# Patient Record
Sex: Female | Born: 1985 | Race: Black or African American | Hispanic: No | Marital: Single | Smoking: Never smoker
Health system: Southern US, Community
[De-identification: ages and names within clinical notes are randomized; demographics above are authoritative.]

## PROBLEM LIST (undated history)

## (undated) DIAGNOSIS — R519 Headache, unspecified: Secondary | ICD-10-CM

## (undated) DIAGNOSIS — I10 Essential (primary) hypertension: Secondary | ICD-10-CM

## (undated) DIAGNOSIS — G932 Benign intracranial hypertension: Secondary | ICD-10-CM

## (undated) HISTORY — DX: Headache, unspecified: R51.9

## (undated) HISTORY — DX: Essential (primary) hypertension: I10

## (undated) HISTORY — DX: Benign intracranial hypertension: G93.2

---
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
## 2014-06-06 ENCOUNTER — Encounter (HOSPITAL_COMMUNITY): Payer: Self-pay | Admitting: Emergency Medicine

## 2014-06-06 ENCOUNTER — Emergency Department (HOSPITAL_COMMUNITY)
Admission: EM | Admit: 2014-06-06 | Discharge: 2014-06-06 | Disposition: A | Discharge status: Home / Self Care | Payer: 59 | Source: Home / Self Care | Attending: Family Medicine | Admitting: Family Medicine

## 2014-06-06 DIAGNOSIS — J039 Acute tonsillitis, unspecified: Secondary | ICD-10-CM

## 2014-06-06 DIAGNOSIS — IMO0001 Reserved for inherently not codable concepts without codable children: Secondary | ICD-10-CM

## 2014-06-06 DIAGNOSIS — R030 Elevated blood-pressure reading, without diagnosis of hypertension: Secondary | ICD-10-CM

## 2014-06-06 LAB — POCT RAPID STREP A: STREPTOCOCCUS, GROUP A SCREEN (DIRECT): NEGATIVE

## 2014-06-06 MED ORDER — CLINDAMYCIN HCL 300 MG PO CAPS: 300.0000 mg | ORAL_CAPSULE | Freq: Four times a day (QID) | ORAL | Status: DC

## 2014-06-06 NOTE — ED Notes (Signed)
Patient complains of sore throat that started this am Also complains of bilateral ear pain

## 2014-06-06 NOTE — Discharge Instructions (Signed)
Your rapid strep test was negative. You are being treated for atypical causes of tonsillitis. In addition, you should be aware that your blood pressure is quite elevated at 173/114 and must be evaluated by the primary care doctor of your choice as soon as possible should you need to begin treatment.   Tonsillitis Tonsillitis is an infection of the throat that causes the tonsils to become red, tender, and swollen. Tonsils are collections of lymphoid tissue at the back of the throat. Each tonsil has crevices (crypts). Tonsils help fight nose and throat infections and keep infection from spreading to other parts of the body for the first 18 months of life.  CAUSES Sudden (acute) tonsillitis is usually caused by infection with streptococcal bacteria. Long-lasting (chronic) tonsillitis occurs when the crypts of the tonsils become filled with pieces of food and bacteria, which makes it easy for the tonsils to become repeatedly infected. SYMPTOMS  Symptoms of tonsillitis include:  A sore throat, with possible difficulty swallowing.  Keithly patches on the tonsils.  Fever.  Tiredness.  New episodes of snoring during sleep, when you did not snore before.  Small, foul-smelling, yellowish-Hodkinson pieces of material (tonsilloliths) that you occasionally cough up or spit out. The tonsilloliths can also cause you to have bad breath. DIAGNOSIS Tonsillitis can be diagnosed through a physical exam. Diagnosis can be confirmed with the results of lab tests, including a throat culture. TREATMENT  The goals of tonsillitis treatment include the reduction of the severity and duration of symptoms and prevention of associated conditions. Symptoms of tonsillitis can be improved with the use of steroids to reduce the swelling. Tonsillitis caused by bacteria can be treated with antibiotic medicines. Usually, treatment with antibiotic medicines is started before the cause of the tonsillitis is known. However, if it is  determined that the cause is not bacterial, antibiotic medicines will not treat the tonsillitis. If attacks of tonsillitis are severe and frequent, your health care provider may recommend surgery to remove the tonsils (tonsillectomy). HOME CARE INSTRUCTIONS   Rest as much as possible and get plenty of sleep.  Drink plenty of fluids. While the throat is very sore, eat soft foods or liquids, such as sherbet, soups, or instant breakfast drinks.  Eat frozen ice pops.  Gargle with a warm or cold liquid to help soothe the throat. Mix 1/4 teaspoon of salt and 1/4 teaspoon of baking soda in 8 oz of water. SEEK MEDICAL CARE IF:   Large, tender lumps develop in your neck.  A rash develops.  A green, yellow-brown, or bloody substance is coughed up.  You are unable to swallow liquids or food for 24 hours.  You notice that only one of the tonsils is swollen. SEEK IMMEDIATE MEDICAL CARE IF:   You develop any new symptoms such as vomiting, severe headache, stiff neck, chest pain, or trouble breathing or swallowing.  You have severe throat pain along with drooling or voice changes.  You have severe pain, unrelieved with recommended medications.  You are unable to fully open the mouth.  You develop redness, swelling, or severe pain anywhere in the neck.  You have a fever. MAKE SURE YOU:   Understand these instructions.  Will watch your condition.  Will get help right away if you are not doing well or get worse. Document Released: 06/12/2005 Document Revised: 01/17/2014 Document Reviewed: 02/19/2013 [facility] Patient Information 2015 [place], [place]. This information is not intended to replace advice given to you by your health care provider. Make sure  you discuss any questions you have with your health care provider.  Hypertension Hypertension, commonly called high blood pressure, is when the force of blood pumping through your arteries is too strong. Your arteries are the blood vessels  that carry blood from your heart throughout your body. A blood pressure reading consists of a higher number over a lower number, such as 110/72. The higher number (systolic) is the pressure inside your arteries when your heart pumps. The lower number (diastolic) is the pressure inside your arteries when your heart relaxes. Ideally you want your blood pressure below 120/80. Hypertension forces your heart to work harder to pump blood. Your arteries may become narrow or stiff. Having hypertension puts you at risk for heart disease, stroke, and other problems.  RISK FACTORS Some risk factors for high blood pressure are controllable. Others are not.  Risk factors you cannot control include:   Race. You may be at higher risk if you are African American.  Age. Risk increases with age.  Gender. Men are at higher risk than women before age 83 years. After age 79, women are at higher risk than men. Risk factors you can control include:  Not getting enough exercise or physical activity.  Being overweight.  Getting too much fat, sugar, calories, or salt in your diet.  Drinking too much alcohol. SIGNS AND SYMPTOMS Hypertension does not usually cause signs or symptoms. Extremely high blood pressure (hypertensive crisis) may cause headache, anxiety, shortness of breath, and nosebleed. DIAGNOSIS  To check if you have hypertension, your health care provider will measure your blood pressure while you are seated, with your arm held at the level of your heart. It should be measured at least twice using the same arm. Certain conditions can cause a difference in blood pressure between your right and left arms. A blood pressure reading that is higher than normal on one occasion does not mean that you need treatment. If one blood pressure reading is high, ask your health care provider about having it checked again. TREATMENT  Treating high blood pressure includes making lifestyle changes and possibly taking  medicine. Living a healthy lifestyle can help lower high blood pressure. You may need to change some of your habits. Lifestyle changes may include:  Following the DASH diet. This diet is high in fruits, vegetables, and whole grains. It is low in salt, red meat, and added sugars.  Getting at least 2 hours of brisk physical activity every week.  Losing weight if necessary.  Not smoking.  Limiting alcoholic beverages.  Learning ways to reduce stress. If lifestyle changes are not enough to get your blood pressure under control, your health care provider may prescribe medicine. You may need to take more than one. Work closely with your health care provider to understand the risks and benefits. HOME CARE INSTRUCTIONS  Have your blood pressure rechecked as directed by your health care provider.   Take medicines only as directed by your health care provider. Follow the directions carefully. Blood pressure medicines must be taken as prescribed. The medicine does not work as well when you skip doses. Skipping doses also puts you at risk for problems.   Do not smoke.   Monitor your blood pressure at home as directed by your health care provider. SEEK MEDICAL CARE IF:   You think you are having a reaction to medicines taken.  You have recurrent headaches or feel dizzy.  You have swelling in your ankles.  You have trouble with your vision.  SEEK IMMEDIATE MEDICAL CARE IF:  You develop a severe headache or confusion.  You have unusual weakness, numbness, or feel faint.  You have severe chest or abdominal pain.  You vomit repeatedly.  You have trouble breathing. MAKE SURE YOU:   Understand these instructions.  Will watch your condition.  Will get help right away if you are not doing well or get worse. Document Released: 09/02/2005 Document Revised: 01/17/2014 Document Reviewed: 06/25/2013 [facility] Patient Information 2015 [place], [place]. This information is not intended to  replace advice given to you by your health care provider. Make sure you discuss any questions you have with your health care provider.

## 2014-06-06 NOTE — ED Provider Notes (Signed)
CSN: [id number]     Arrival date & time 06/06/14  4098 History   First MD Initiated Contact with Patient 06/06/14 1900     Chief Complaint  Patient presents with  . Sore Throat   (Consider location/radiation/quality/duration/timing/severity/associated sxs/prior Treatment) HPI Comments: Patient states she was caring for her niece yesterday who had cough and cold symptoms and when she woke this morning she had a sore throat.   The history is provided by the patient.    History reviewed. No pertinent past medical history. History reviewed. No pertinent past surgical history. No family history on file. History  Substance Use Topics  . Smoking status: Not on file  . Smokeless tobacco: Not on file  . Alcohol Use: Not on file   OB History   Grav Para Term Preterm Abortions TAB SAB Ect Mult Living                 Review of Systems  All other systems reviewed and are negative.   Allergies  Review of patient's allergies indicates no known allergies.  Home Medications   Prior to Admission medications   Medication Sig Start Date End Date Taking? Authorizing Provider  clindamycin (CLEOCIN) 300 MG capsule Take 1 capsule (300 mg total) by mouth 4 (four) times daily. X 7 days 06/06/14   Jess Barters H Presson, PA   BP 173/114  Pulse 60  Temp(Src) 98.6 F (37 C) (Oral)  Resp 20  SpO2 99% Physical Exam  Nursing note and vitals reviewed. Constitutional: She is oriented to person, place, and time. She appears well-developed and well-nourished.  +obese  HENT:  Head: Normocephalic and atraumatic.  Right Ear: Hearing, tympanic membrane, external ear and ear canal normal.  Left Ear: Hearing, tympanic membrane, external ear and ear canal normal.  Nose: Nose normal.  Mouth/Throat: Uvula is midline. No oral lesions. No trismus in the jaw. No uvula swelling. Oropharyngeal exudate, posterior oropharyngeal edema and posterior oropharyngeal erythema present. No tonsillar abscesses.  +moderate  bilateral exudative tonsillitis  Eyes: Conjunctivae are normal. No scleral icterus.  Neck: Normal range of motion. Neck supple.  Cardiovascular: Normal rate, regular rhythm and normal heart sounds.   Pulmonary/Chest: Effort normal and breath sounds normal. No stridor. No respiratory distress. She has no wheezes.  Musculoskeletal: Normal range of motion.  Lymphadenopathy:    She has no cervical adenopathy.  Neurological: She is alert and oriented to person, place, and time.  Skin: Skin is warm and dry.  Psychiatric: She has a normal mood and affect. Her behavior is normal.    ED Course  Procedures (including critical care time) Labs Review Labs Reviewed  POCT RAPID STREP A ([facility]RG CARE ONLY)    Imaging Review No results found.   MDM   1. Tonsillitis   2. Elevated blood pressure    rapid strep test was negative. treated for atypical causes of tonsillitis with 7 days course of clindamycin. In addition, patient made aware that her blood pressure is quite elevated at 173/114 and must be evaluated by the primary care doctor of her choice as soon as possible should she need to begin treatment.    Ria Clock, [place] 06/06/14 2027

## 2014-06-07 NOTE — ED Provider Notes (Signed)
Medical screening examination/treatment/procedure(s) were performed by resident physician or non-physician practitioner and as supervising physician I was immediately available for consultation/collaboration.   Barkley Bruns MD.   Linna Hoff, MD 06/07/14 [phone]

## 2014-06-08 LAB — CULTURE, GROUP A STREP

## 2015-01-16 ENCOUNTER — Encounter: Payer: Self-pay | Admitting: Dietician

## 2015-01-16 ENCOUNTER — Encounter: Payer: 59 | Attending: Internal Medicine | Admitting: Dietician

## 2015-01-16 DIAGNOSIS — Z6841 Body Mass Index (BMI) 40.0 and over, adult: Secondary | ICD-10-CM | POA: Diagnosis not present

## 2015-01-16 DIAGNOSIS — Z713 Dietary counseling and surveillance: Secondary | ICD-10-CM | POA: Diagnosis not present

## 2015-01-16 NOTE — Progress Notes (Signed)
  Medical Nutrition Therapy:  Appt start time: 1410 end time:  1450.   Assessment:  Primary concerns today: Mariah BradfordKimberly is here today since she would like to lose weight. Has been working on losing by going to the gym for 2.5 hours 5 x week, drinking more water, cut out soda, doesn't eat fried foods, and eating 3 meals per day. Was taking Phentermine from January - April and lost about 25 lbs. Has been at a plateau for about 2 weeks. Stopped taking it because it seemed like it was no longer working. Would like to get weight down to 250 lbs.   Started working on [employer] in 10/2013 and weight was 410 lbs at that time.   Works at [employer] regular business hours and lives by herself. Don't skip any meals and eats out about 1 x week. Feels like portion sizes are small and has a portion plate that she uses. Gets fuller faster than she used to. Feels like she might not be eating enough calories.  Preferred Learning Style:   No preference indicated   Learning Readiness:   Ready  MEDICATIONS: see list   DIETARY INTAKE:  Usual eating pattern includes 3 meals and 2 snacks per day.  Avoided foods include: none  24-hr recall:  B ( AM): frosted mini wheats with 1% milk or oatmeal with yogurt or grits and boiled egg Snk ( AM):peanut butter crackers, fruit, almonds, or yogurt L ( PM): chicken and vegetables or salad with chicken  Snk ( PM):peanut butter crackers, fruit, almonds, or yogurt D ( PM): chicken and vegetables or salad with chicken  Snk ( PM):none Beverages: 1 gallon of water and green tea sometimes (sweet)  Usual physical activity:  5 x week working out for 2.5 hours  Estimated energy needs: 2000 calories 225 g carbohydrates 150 g protein 56 g fat  Progress Towards Goal(s):  In progress.   Nutritional Diagnosis:  [place]-3.3 Overweight/obesity As related to hx of poor foods choices.  As evidenced by BMI of 55.5.    Intervention:  Nutrition counseling provided. Plan: Continue having 3  meals per day and 2 snacks if you are hungry.  Have protein with carbs at all meals and snacks. For breakfast - try adding a boiled egg to cereal (look at serving size) or have a protein shake with fruit. Aim to fill half of your plate with vegetables at lunch and dinner. Have protein the size of the palm of your hand and starch/carb on a quarter of your plate. Think about increasing intensity and shortening duration of exercise on some days or think about increasing weights.  Think about taking pictures, taking measurements, and/or think about keeping a journal about accomplishments.  Teaching Method Utilized:  Visual Auditory Hands on  Handouts given during visit include:  MyPlate Handout  Meal Card  15 g CHO Snacks  Barriers to learning/adherence to lifestyle change: none  Demonstrated degree of understanding via:  Teach Back   Monitoring/Evaluation:  Dietary intake, exercise, and body weight prn.

## 2015-01-16 NOTE — Patient Instructions (Signed)
Continue having 3 meals per day and 2 snacks if you are hungry.  Have protein with carbs at all meals and snacks. For breakfast - try adding a boiled egg to cereal (look at serving size) or have a protein shake with fruit. Aim to fill half of your plate with vegetables at lunch and dinner. Have protein the size of the palm of your hand and starch/carb on a quarter of your plate. Think about increasing intensity and shortening duration of exercise on some days or think about increasing weights.  Think about taking pictures, taking measurements, and/or think about keeping a journal about accomplishments.

## 2018-08-16 HISTORY — PX: LAPAROSCOPIC GASTRIC SLEEVE RESECTION: SHX5895

## 2021-11-06 ENCOUNTER — Encounter (HOSPITAL_COMMUNITY): Payer: Self-pay

## 2021-11-06 ENCOUNTER — Emergency Department (HOSPITAL_COMMUNITY): Payer: 59

## 2021-11-06 ENCOUNTER — Emergency Department (HOSPITAL_COMMUNITY)
Admission: EM | Admit: 2021-11-06 | Discharge: 2021-11-06 | Disposition: A | Discharge status: Home / Self Care | Payer: 59 | Attending: Emergency Medicine | Admitting: Emergency Medicine

## 2021-11-06 ENCOUNTER — Other Ambulatory Visit: Payer: Self-pay

## 2021-11-06 DIAGNOSIS — G932 Benign intracranial hypertension: Secondary | ICD-10-CM | POA: Diagnosis not present

## 2021-11-06 DIAGNOSIS — R519 Headache, unspecified: Secondary | ICD-10-CM | POA: Diagnosis present

## 2021-11-06 MED ORDER — ACETAZOLAMIDE 250 MG PO TABS: 500.0000 mg | ORAL_TABLET | Freq: Two times a day (BID) | ORAL | 3 refills | Status: DC

## 2021-11-06 NOTE — ED Triage Notes (Signed)
Pt arrives POV for eval of HA x weeks, went to UC. Pt reports her BP was elevated. Went to ophthalmologist after noting new floaters in her eye. Pt reports the MD there told her she had "fluid around a nerve" and needed to come here for r/o "brain tumor". Reports Ha have since resolved after starting BP medication. Reports floaters are ongoing

## 2021-11-06 NOTE — ED Provider Notes (Signed)
[facility] EMERGENCY DEPARTMENT Provider Note   CSN: [id number] Arrival date & time: 11/06/21  1727     History  Chief Complaint  Patient presents with   Headache    Mariah Combs is a 36 y.o. female.  She is here with a complaint of 4 weeks of on and off headache mostly posterior although also behind her eyes.  Dull in nature, gradual in onset.  For the last 2 weeks she has had on and off floaters in her vision and sometimes some blurry vision.  She saw an eye doctor yesterday who told her she had some fluid around her nerve and that she needed to see a neurologist.  Recommended she come here for an MRI.  Currently no headache.  Blood pressure has improved since starting on medication.  No numbness or weakness or balance issues.  The history is provided by the patient.  Headache Pain location:  Occipital and frontal Quality:  Dull Severity currently:  0/10 Severity at highest:  5/10 Onset quality:  Gradual Duration:  4 weeks Timing:  Intermittent Progression:  Improving Chronicity:  New Relieved by:  Nothing Worsened by:  Nothing Ineffective treatments:  None tried Associated symptoms: visual change   Associated symptoms: no abdominal pain, no cough, no dizziness, no fever, no focal weakness, no hearing loss, no loss of balance, no neck pain, no numbness, no photophobia, no sore throat and no weakness       Home Medications Prior to Admission medications   Medication Sig Start Date End Date Taking? Authorizing Provider  clindamycin (CLEOCIN) 300 MG capsule Take 1 capsule (300 mg total) by mouth 4 (four) times daily. X 7 days Patient not taking: Reported on 01/16/2015 06/06/14   Lutricia Feil, PA  Vitamin D, Ergocalciferol, (DRISDOL) 50000 UNITS CAPS capsule Take 50,000 Units by mouth every 7 (seven) days.    [provider]      Allergies    Patient has no known allergies.    Review of Systems   Review of Systems  Constitutional:   Negative for fever.  HENT:  Negative for hearing loss and sore throat.   Eyes:  Positive for visual disturbance. Negative for photophobia.  Respiratory:  Negative for cough and shortness of breath.   Cardiovascular:  Negative for chest pain.  Gastrointestinal:  Negative for abdominal pain.  Genitourinary:  Negative for dysuria.  Musculoskeletal:  Negative for neck pain.  Skin:  Negative for rash.  Neurological:  Positive for headaches. Negative for dizziness, focal weakness, weakness, numbness and loss of balance.   Physical Exam Updated Vital Signs BP 134/89 (BP Location: Right Arm)    Pulse 66    Temp 98.3 F (36.8 C) (Oral)    Resp 16    Ht 5\' 7"  (1.702 m)    Wt (!) 161 kg    SpO2 100%    BMI 55.59 kg/m  Physical Exam Vitals and nursing note reviewed.  Constitutional:      General: She is not in acute distress.    Appearance: She is well-developed.  HENT:     Head: Normocephalic and atraumatic.  Eyes:     Conjunctiva/sclera: Conjunctivae normal.  Cardiovascular:     Rate and Rhythm: Normal rate and regular rhythm.     Heart sounds: No murmur heard. Pulmonary:     Effort: Pulmonary effort is normal. No respiratory distress.     Breath sounds: Normal breath sounds.  Abdominal:  Palpations: Abdomen is soft.     Tenderness: There is no abdominal tenderness.  Musculoskeletal:        General: No swelling.     Cervical back: Neck supple.  Skin:    General: Skin is warm and dry.     Capillary Refill: Capillary refill takes less than 2 seconds.  Neurological:     Mental Status: She is alert.     GCS: GCS eye subscore is 4. GCS verbal subscore is 5. GCS motor subscore is 6.     Cranial Nerves: No cranial nerve deficit.     Sensory: No sensory deficit.     Motor: No weakness.    ED Results / Procedures / Treatments   Labs (all labs ordered are listed, but only abnormal results are displayed) Labs Reviewed - No data to display  EKG None  Radiology MR BRAIN WO  CONTRAST  Result Date: 11/06/2021 CLINICAL DATA:  Headache, chronic, new features or increased frequency EXAM: MRI HEAD WITHOUT CONTRAST TECHNIQUE: Multiplanar, multiecho pulse sequences of the brain and surrounding structures were obtained without intravenous contrast. COMPARISON:  None. FINDINGS: Brain: There is no acute infarction or intracranial hemorrhage. There is no intracranial mass, mass effect, or edema. There is no hydrocephalus or extra-axial fluid collection. Ventricles and sulci are normal in size and configuration. Vascular: Major vessel flow voids at the skull base are preserved. Skull and upper cervical spine: Normal marrow signal is preserved. Sinuses/Orbits: Minor mucosal thickening. Suspect bilateral protrusion of optic nerves into the posterior globes. Mild prominence of CSF within the optic nerve sheaths. Orbits are otherwise unremarkable. Other: Sella is partially empty. Mastoid air cells are clear. Fluid opacification of pneumatized right petrous apex. IMPRESSION: Constellation of findings suggest idiopathic intracranial hypertension. There is papilledema by imaging; correlate with funduscopic exam. Electronically Signed   By: Macy Mis M.D.   On: 11/06/2021 19:58    Procedures Procedures    Medications Ordered in ED Medications - No data to display  ED Course/ Medical Decision Making/ A&P Clinical Course as of 11/07/21 1309  Tue Nov 06, 2021  2030 Reviewed case with Dr. Rory Percy.  He is recommending LP with opening pressure.  If unavailable then patient can have an outpatient IR fluoroscopy directed LP.  Treatment with acetazolamide 500 twice daily and outpatient neurology and ophthalmology. [MB]  2037 I reviewed recommendations from neurology with the patient.  We also discussed doing a bedside lumbar puncture.  Patient declines this at this time.  She would rather follow-up outpatient with radiology.  Indications for return discussed. [MB]    Clinical Course User  Index [MB] Hayden Rasmussen, MD                           Medical Decision Making Risk Prescription drug management.  This patient complains of on and off headache and visual symptoms; this involves an extensive number of treatment Options and is a complaint that carries with it a high risk of complications and morbidity. The differential includes tension headache, migraine, idiopathic intracranial hypertension, optic neuritis, hypertensive urgency/emergency I ordered imaging studies which included MRI brain and I independently    visualized and interpreted imaging which showed evidence of IIH.  Previous records obtained and reviewed in epic including recent urgent care visits I consulted neurology Dr. Rory Percy And discussed lab and imaging findings and discussed disposition.  Cardiac monitoring reviewed, normal sinus rhythm Social determinants considered, no significant barriers identified Critical  Interventions: None  After the interventions stated above, I reevaluated the patient and found patient to currently be asymptomatic.  No visual symptoms. Admission and further testing considered, patient offered LP.  She declines.  Will refer to outpatient neurology and ophthamology and also placed a referral into interventional radiology for fluoroscopy guided LP.  Will start on acetazolamide per neurology recommendations.  Return instructions discussed.          Final Clinical Impression(s) / ED Diagnoses Final diagnoses:  Idiopathic intracranial hypertension    Rx / DC Orders ED Discharge Orders          Ordered    acetaZOLAMIDE (DIAMOX) 250 MG tablet  2 times daily        11/06/21 2129    DG FL GUIDED LUMBAR PUNCTURE        11/06/21 2129    Ambulatory referral to Neurology       Comments: An appointment is requested in approximately: 2 weeks   11/06/21 2129              Hayden Rasmussen, MD 11/07/21 1314

## 2021-11-06 NOTE — ED Notes (Signed)
Patient transported to MRI 

## 2021-11-06 NOTE — Discharge Instructions (Signed)
Your MRI showed signs of idiopathic intracranial hypertension.  Neurology is recommending that you get a lumbar puncture and start on a medication called acetazolamide.  We are putting in a referral for you for interventional radiology to do the lumbar puncture.  We are putting in a referral for outpatient neurology for you and you should also follow-up with ophthalmology.  Return to the emergency department if any worsening or concerning symptoms.

## 2021-11-06 NOTE — Progress Notes (Signed)
On-call neurology note  Called by Dr. Charm Barges regarding this patient who presented to an urgent care for headaches and visual floaters.  Concern for papilledema bilaterally.  MRI of the brain without contrast also concerning for IIHT.  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  If pressure is more than 20 cm of water, needs high-volume CSF drainage for closing pressure to be 20 or less. - Start on acetazolamide 500 twice daily. - Outpatient neurology and ophthalmology follow-up.  -- Milon Dikes, MD Neurologist [facility] Pager: [phone]  No charge note

## 2021-11-06 NOTE — ED Provider Triage Note (Signed)
Emergency Medicine Provider Triage Evaluation Note  Mariah Combs , a 36 y.o. female  was evaluated in triage.  Pt complains of headache.  According to patient she has been having headaches for several weeks.  She went to urgent care today and was told that her blood pressure was elevated. They placed her on antihypertensives there. She has also been having associated floaters in her left eye.  She was immediately referred to the ophthalmologist and was seen today.  They said when they evaluated her eyes they saw "fluid around the nerve" and they wanted her to come to the emergency department to rule out mass occupying lesion.  Apparently the ophthalmologist had discussed her case with neurology, however she would be unable to get in within a reasonable time frame and she was referred to the emergency department.  They requested for her to have MRI imaging of her brain.  Patient says that her headache is not present anymore.  Review of Systems  Positive:  Negative:   Physical Exam  BP 134/89 (BP Location: Right Arm)    Pulse 66    Temp 98.3 F (36.8 C) (Oral)    Resp 16    Ht 5\' 7"  (1.702 m)    Wt (!) 161 kg    SpO2 100%    BMI 55.59 kg/m  Gen:   Awake, no distress   Resp:  Normal effort  MSK:   Moves extremities without difficulty  Other:    Medical Decision Making  Medically screening exam initiated at 5:49 PM.  Appropriate orders placed.  Mariah Combs was informed that the remainder of the evaluation will be completed by another provider, this initial triage assessment does not replace that evaluation, and the importance of remaining in the ED until their evaluation is complete.  MRI placed. I could not review the opthalmology note.     Michel Harrow, Claudie Leach 11/06/21 1753

## 2021-11-12 ENCOUNTER — Encounter: Payer: Self-pay | Admitting: Psychiatry

## 2021-11-12 ENCOUNTER — Telehealth: Payer: Self-pay | Admitting: Psychiatry

## 2021-11-12 ENCOUNTER — Ambulatory Visit: Payer: 59 | Admitting: Psychiatry

## 2021-11-12 VITALS — BP 119/75 | HR 67 | Ht 67.0 in | Wt 361.0 lb

## 2021-11-12 DIAGNOSIS — R519 Headache, unspecified: Secondary | ICD-10-CM | POA: Diagnosis not present

## 2021-11-12 DIAGNOSIS — H4710 Unspecified papilledema: Secondary | ICD-10-CM | POA: Diagnosis not present

## 2021-11-12 NOTE — Progress Notes (Signed)
Referring:  Terrilee Files, MD [address] [place],  [place] [address]  PCP: Pcp, No  Neurology was asked to evaluate Mariah Combs, a 36 year old female for a chief complaint of headaches.  Our recommendations of care will be communicated by shared medical record.    CC:  headaches  HPI:  Medical co-morbidities: s/p gastric bypass  The patient presents for evaluation of headaches which began 5 weeks ago. At that time she developed constant occipital and retro-orbital pressure. Headaches were worse when she would lie on her back. Went to urgent care where she was given a nasal spray, which did not help her headaches. She was found to have high blood pressure at urgent care as well. She was started on BP medication which did improve her blood pressure but did not improve the headaches.  She was having blurry vision and a floater in the left eye, so she went to the eye doctor. There she was found to have papilledema in both eyes. She was told to go to the ER where MRI brain was done. 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 She was started on Diamox 500 mg BID for presumed IIH.  She hasn't had headaches since starting Diamox. Blurry vision has also improved. She does still occasionally have a black floater in her left eye.  Headache History: Onset: 5 weeks ago Triggers: lying on her back Aura: black floater in left eye Location: occipital, retro-orbital Quality/Description: pressure Associated Symptoms:  Photophobia: no  Phonophobia: no  Nausea: no Worse with activity?: no Duration of headaches: constant  Headache days per month: 21 Headache free days per month: 9  Current Treatment: Abortive Tylenol  Preventative Diamox 500 mg BID  Prior Therapies                                 Diamox 500 mg BID   LABS: None  IMAGING:  MRI Brain 11/06/21: Partially empty sella, flattened globes  Imaging independently reviewed on November 12, 2021   Current  Outpatient Medications on File Prior to Visit  Medication Sig Dispense Refill   acetaZOLAMIDE (DIAMOX) 250 MG tablet Take 2 tablets (500 mg total) by mouth 2 (two) times daily. 120 tablet 3   losartan-hydrochlorothiazide (HYZAAR) 50-12.5 MG tablet Take 1 tablet by mouth daily.     No current facility-administered medications on file prior to visit.     Allergies: No Known Allergies  Family History: Family History  Problem Relation Age of Onset   Diabetes Mother    Heart attack Sister      Past Medical History: Past Medical History:  Diagnosis Date   Headache    Hypertension    IIH (idiopathic intracranial hypertension)     Past Surgical History Past Surgical History:  Procedure Laterality Date   LAPAROSCOPIC GASTRIC SLEEVE RESECTION  08/2018    Social History: Social History   Tobacco Use   Smoking status: Never   Smokeless tobacco: Never  Substance Use Topics   Alcohol use: Yes    Comment: social   Drug use: Never    ROS: Negative for fevers, chills. Positive for headaches, vision changes. All other systems reviewed and negative unless stated otherwise in HPI.   Physical Exam:   Vital Signs: BP 119/75    Pulse 67    Ht 5\' 7"  (1.702 m)    Wt (!) 361 lb (163.7 kg)  BMI 56.54 kg/m  GENERAL: well appearing,in no acute distress,alert SKIN:  Color, texture, turgor normal. No rashes or lesions HEAD:  Normocephalic/atraumatic. CV:  RRR RESP: Normal respiratory effort MSK: no tenderness to palpation over occiput, neck, or shoulders  NEUROLOGICAL: Mental Status: Alert, oriented to person, place and time,Follows commands Cranial Nerves: PERRL, blurred disc margins bilaterally, visual fields intact to confrontation, extraocular movements intact, facial sensation intact, no facial droop or ptosis, hearing grossly intact, no dysarthria Motor: muscle strength 5/5 both upper and lower extremities,no drift, normal tone Reflexes: 2+ throughout Sensation: intact to  light touch all 4 extremities Coordination: Finger-to- nose-finger intact bilaterally Gait: normal-based   IMPRESSION: 36 year old female with a history of gastric bypass, HTN who presents for evaluation of headaches and papilledema. Will plan for CTV to rule out venous thrombosis, then will plan for an LP to measure opening pressure. She has had improvement since starting Diamox, will continue current dose for now. States she has only seen an optometrist so far. Will refer to ophthalmology to help monitor papilledema while on treatment for IIH.  PLAN: -Continue Diamox 500 mg BID -CTV, then LP -Referral to ophthalmology   I spent a total of 32 minutes chart reviewing and counseling the patient. Headache education was done. Discussed treatment options including preventive medications. Discussed medication side effects, adverse reactions and drug interactions. Written educational materials and patient instructions outlining all of the above were given.  Follow-up: 4 months   Ocie Doyne, MD 11/12/2021   3:23 PM

## 2021-11-12 NOTE — Patient Instructions (Signed)
Plan: CT scan of veins Spinal tap Continue Diamox 500 mg twice a day

## 2021-11-12 NOTE — Telephone Encounter (Signed)
Sent to Dr. Groat ph # [phone] 

## 2021-11-13 ENCOUNTER — Telehealth: Payer: Self-pay | Admitting: Psychiatry

## 2021-11-13 NOTE — Telephone Encounter (Signed)
Mariah Combs with Gi messaged me and informed me.   "scheduled 3/16 - I had an appointment for Thursday available and Friday but she has a spinal tap scheduled Thursday, she didn't want to schedule either those days. "

## 2021-11-13 NOTE — Telephone Encounter (Signed)
[employer] [facility]kley Harvey: [id number] (exp. 11/13/21 to 12/28/21) order sent to GI to be scheduled as soon as possible.  I also [employer] with GI to schedule the patient LP.

## 2021-11-15 ENCOUNTER — Ambulatory Visit
Admission: RE | Admit: 2021-11-15 | Discharge: 2021-11-15 | Disposition: A | Discharge status: Home / Self Care | Payer: 59 | Source: Ambulatory Visit | Attending: Psychiatry | Admitting: Psychiatry

## 2021-11-15 DIAGNOSIS — R519 Headache, unspecified: Secondary | ICD-10-CM

## 2021-11-15 LAB — CSF CELL COUNT WITH DIFFERENTIAL
RBC Count, CSF: 680 cells/uL — ABNORMAL HIGH
WBC, CSF: 1 cells/uL (ref 0–5)

## 2021-11-15 NOTE — Discharge Instructions (Signed)

## 2021-11-19 ENCOUNTER — Telehealth: Payer: Self-pay | Admitting: *Deleted

## 2021-11-19 NOTE — Telephone Encounter (Signed)
Received LP lab results from [facility], placed on Dr Quentin Mulling desk for review.  ?

## 2021-11-21 ENCOUNTER — Other Ambulatory Visit: Payer: 59

## 2021-11-22 ENCOUNTER — Ambulatory Visit
Admission: RE | Admit: 2021-11-22 | Discharge: 2021-11-22 | Disposition: A | Discharge status: Home / Self Care | Payer: 59 | Source: Ambulatory Visit | Attending: Psychiatry | Admitting: Psychiatry

## 2021-11-22 DIAGNOSIS — R519 Headache, unspecified: Secondary | ICD-10-CM

## 2021-11-22 MED ORDER — IOPAMIDOL (ISOVUE-300) INJECTION 61%: 100.0000 mL | Freq: Once | INTRAVENOUS | Status: DC | PRN

## 2021-11-22 MED ORDER — IOPAMIDOL (ISOVUE-370) INJECTION 76%
75.0000 mL | Freq: Once | INTRAVENOUS | Status: AC | PRN
  Administered 2021-11-22: 75 mL via INTRAVENOUS

## 2021-11-29 ENCOUNTER — Other Ambulatory Visit: Payer: 59

## 2022-03-11 ENCOUNTER — Encounter: Payer: Self-pay | Admitting: Psychiatry

## 2022-03-11 ENCOUNTER — Ambulatory Visit: Payer: 59 | Admitting: Psychiatry

## 2022-03-11 VITALS — BP 134/88 | HR 61 | Ht 67.0 in | Wt 377.5 lb

## 2022-03-11 DIAGNOSIS — H4710 Unspecified papilledema: Secondary | ICD-10-CM

## 2022-03-11 NOTE — Progress Notes (Signed)
   CC:  headaches  Follow-up Visit  Last visit: 11/12/21  Brief HPI: 36 year old female with a history of gastric bypass who follows in clinic for papilledema and headaches.  At her last visit CTV and LP were ordered. She was continued on Diamox 500 mg BID and referred to ophthalmology.  Interval History: CTV showed narrowing of the distal transverse sinuses. Lumbar puncture revealed a normal opening pressure (16). She had been on Diamox for ~4 weeks prior to LP.  Saw her eye doctor in April who noted resolution of her papilledema.  She has not had any headaches or vision changes since her last visit. She is continuing to take Diamox 500 mg BID.  Headache days per month: 0 Headache free days per month: 30  Current Headache Regimen: Preventative: Diamox 500 mg BID   Prior Therapies                                  Diamox 500 mg BID  Physical Exam:   Vital Signs: BP 134/88   Pulse 61   Ht 5\' 7"  (1.702 m)   Wt (!) 377 lb 8 oz (171.2 kg)   BMI 59.12 kg/m  GENERAL:  well appearing, in no acute distress, alert  SKIN:  Color, texture, turgor normal. No rashes or lesions HEAD:  Normocephalic/atraumatic. RESP: normal respiratory effort MSK:  No gross joint deformities.   NEUROLOGICAL: Mental Status: Alert, oriented to person, place and time, Follows commands, and Speech fluent and appropriate. Cranial Nerves: PERRL, no papilledema visualized, face symmetric, no dysarthria, hearing grossly intact Motor: moves all extremities equally Gait: normal-based.  IMPRESSION: 36 year old female with a history of gastric bypass who presents for follow up of papilledema. CTV was unremarkable. LP showed a normal opening pressure, however she had already been on Diamox for 4 weeks prior to LP. Her headaches and papilledema have resolved on Diamox. Will stop Diamox. Counseled that weight management may help prevent IIH in the future.  PLAN: -Decrease Diamox to 250 mg BID x1 week then  stop -Return to clinic if headaches or vision worsen   Follow-up: as needed  I spent a total of 22 minutes on the date of the service. Headache education was done. Written educational materials and patient instructions outlining all of the above were given.  Ocie Doyne, MD 03/11/22 1:58 PM

## 2022-11-18 ENCOUNTER — Encounter (HOSPITAL_COMMUNITY): Payer: Self-pay

## 2022-11-18 ENCOUNTER — Ambulatory Visit (HOSPITAL_COMMUNITY)
Admission: RE | Admit: 2022-11-18 | Discharge: 2022-11-18 | Disposition: A | Discharge status: Home / Self Care | Payer: 59 | Source: Ambulatory Visit

## 2022-11-18 VITALS — BP 147/107 | HR 65 | Temp 98.5°F | Resp 16

## 2022-11-18 DIAGNOSIS — J0100 Acute maxillary sinusitis, unspecified: Secondary | ICD-10-CM | POA: Diagnosis not present

## 2022-11-18 DIAGNOSIS — I10 Essential (primary) hypertension: Secondary | ICD-10-CM | POA: Diagnosis not present

## 2022-11-18 MED ORDER — AMOXICILLIN-POT CLAVULANATE 875-125 MG PO TABS: 1.0000 | ORAL_TABLET | Freq: Two times a day (BID) | ORAL | 0 refills | Status: AC

## 2022-11-18 MED ORDER — FLUTICASONE PROPIONATE 50 MCG/ACT NA SUSP: 1.0000 | Freq: Every day | NASAL | 0 refills | Status: AC

## 2022-11-18 NOTE — ED Provider Notes (Signed)
[facility]    CSN: [id number] Arrival date & time: 11/18/22  1153      History   Chief Complaint Chief Complaint  Patient presents with   Nasal Congestion    Sinuses and Ear - Entered by patient    HPI Mariah Combs is a 37 y.o. female.   Patient presents today with a weeklong history of URI symptoms including congestion, runny nose, left otalgia, sinus pressure.  She denies any chest pain, shortness of breath, nausea, vomiting.  Denies any known sick contacts.  She has not had COVID in the past.  She has a COVID-19 vaccinations.  She does have seasonal allergies managed with over-the-counter allergy medication.  Denies any history of asthma, COPD, smoking.  She has tried over-the-counter medication multisymptom medication without improvement of symptoms.  She denies any recent antibiotics or steroids.  She presents today given worsening symptoms.  Her blood pressure is very elevated.  She denies any headache, chest pain, shortness of breath, vision change, dizziness.  She has been using over-the-counter multisymptom medication that includes a decongestant.  Denies any significant NSAID use.  Denies increased caffeine or sodium consumption.  She has been compliant with her blood pressure medication.  She is able to monitor her blood pressure at home.    Past Medical History:  Diagnosis Date   Headache    Hypertension    IIH (idiopathic intracranial hypertension)     There are no problems to display for this patient.   Past Surgical History:  Procedure Laterality Date   LAPAROSCOPIC GASTRIC SLEEVE RESECTION  08/2018    OB History   No obstetric history on file.      Home Medications    Prior to Admission medications   Medication Sig Start Date End Date Taking? Authorizing Provider  amoxicillin-clavulanate (AUGMENTIN) 875-125 MG tablet Take 1 tablet by mouth every 12 (twelve) hours. 11/18/22  Yes Milano Rosevear K, PA-C  fluticasone (FLONASE) 50 MCG/ACT  nasal spray Place 1 spray into both nostrils daily. 11/18/22  Yes Tomeko Scoville K, PA-C  losartan (COZAAR) 50 MG tablet Take by mouth. 09/23/22  Yes [provider]    Family History Family History  Problem Relation Age of Onset   Diabetes Mother    Heart attack Sister     Social History Social History   Tobacco Use   Smoking status: Never   Smokeless tobacco: Never  Substance Use Topics   Alcohol use: Yes    Comment: social   Drug use: Never     Allergies   Patient has no known allergies.   Review of Systems Review of Systems  Constitutional:  Positive for activity change. Negative for appetite change, fatigue and fever.  HENT:  Positive for congestion, postnasal drip and sinus pressure. Negative for sneezing and sore throat.   Respiratory:  Negative for cough and shortness of breath.   Cardiovascular:  Negative for chest pain.  Gastrointestinal:  Negative for abdominal pain, diarrhea, nausea and vomiting.  Neurological:  Negative for dizziness, light-headedness and headaches.     Physical Exam Triage Vital Signs ED Triage Vitals  Enc Vitals Group     BP 11/18/22 1217 (!) 173/117     Pulse Rate 11/18/22 1217 63     Resp 11/18/22 1217 16     Temp 11/18/22 1217 98.2 F (36.8 C)     Temp Source 11/18/22 1217 Oral     SpO2 11/18/22 1217 97 %     Weight --  Height --      Head Circumference --      Peak Flow --      Pain Score 11/18/22 1219 4     Pain Loc --      Pain Edu? --      Excl. in [place]? --    No data found.  Updated Vital Signs BP (!) 147/107 (BP Location: Right Wrist)   Pulse 65   Temp 98.5 F (36.9 C) (Oral)   Resp 16   LMP 11/17/2022   SpO2 98%   Visual Acuity Right Eye Distance:   Left Eye Distance:   Bilateral Distance:    Right Eye Near:   Left Eye Near:    Bilateral Near:     Physical Exam Vitals reviewed.  Constitutional:      General: She is awake. She is not in acute distress.    Appearance: Normal appearance. She  is well-developed. She is not ill-appearing.     Comments: Very pleasant female appears stated age no acute distress sitting comfortably in exam room  HENT:     Head: Normocephalic and atraumatic.     Right Ear: Tympanic membrane, ear canal and external ear normal. Tympanic membrane is not erythematous or bulging.     Left Ear: Tympanic membrane, ear canal and external ear normal. Tympanic membrane is not erythematous or bulging.     Nose:     Right Sinus: Maxillary sinus tenderness present. No frontal sinus tenderness.     Left Sinus: Maxillary sinus tenderness present. No frontal sinus tenderness.     Mouth/Throat:     Pharynx: Uvula midline. No oropharyngeal exudate or posterior oropharyngeal erythema.     Comments: Drainage present posterior oropharynx Cardiovascular:     Rate and Rhythm: Normal rate and regular rhythm.     Heart sounds: Normal heart sounds, S1 normal and S2 normal. No murmur heard. Pulmonary:     Effort: Pulmonary effort is normal.     Breath sounds: Normal breath sounds. No wheezing, rhonchi or rales.     Comments: Clear to auscultation bilaterally Psychiatric:        Behavior: Behavior is cooperative.      UC Treatments / Results  Labs (all labs ordered are listed, but only abnormal results are displayed) Labs Reviewed - No data to display  EKG   Radiology No results found.  Procedures Procedures (including critical care time)  Medications Ordered in UC Medications - No data to display  Initial Impression / Assessment and Plan / UC Course  I have reviewed the triage vital signs and the nursing notes.  Pertinent labs & imaging results that were available during my care of the patient were reviewed by me and considered in my medical decision making (see chart for details).     Patient is well-appearing, afebrile, nontoxic, nontachycardic.  No indication for viral testing as she has been symptomatic for over a week and this would not change  management.  Given prolonged and recent worsening of symptoms concern for secondary bacterial infection.  Will start Augmentin twice daily for 7 days.  Recommended to use Flonase to help with her nasal congestion.  She can use over-the-counter medication including Mucinex, Flonase, Tylenol but recommend that she avoid NSAIDs and decongestants due to elevated blood pressure.  Discussed that if her symptoms or not improving within a week she should return for reevaluation.  If she has any worsening symptoms including chest pain, shortness of breath, severe cough, fever, nausea/vomiting  interfering with oral intake she should be seen immediately.  Strict return precautions given.  Offered work excuse note with patient declined.  Blood pressure is very elevated.  Patient denies any signs/symptoms of endorgan damage.  Discussed that this could be situational related to illness and use of decongestants.  Recommend that she avoid decongestants, caffeine, sodium, NSAIDs.  She is to monitor her blood pressure at home and if this is persistently above 140/90 she should return here or see her primary care for medication adjustment.  Discussed that if she develops any chest pain, shortness of breath, headache, vision change, dizziness in setting of high blood pressure she needs to be seen immediately.  Strict turn precautions given to which she expressed understanding.  Final Clinical Impressions(s) / UC Diagnoses   Final diagnoses:  Acute non-recurrent maxillary sinusitis  Elevated blood pressure reading with diagnosis of hypertension     Discharge Instructions      I am concerned that you might have a bacterial infection.  Will start Augmentin twice daily.  Use Flonase to help with your congestion.  I also recommend that you use over-the-counter medications including Mucinex and multisymptom medications that are safe for high blood pressure such as Coricidin.  Make sure that you rest and drink plenty of fluid.   If your symptoms not improving within a week return for reevaluation.  If anything worsens you should be seen immediately.  Your blood pressure is very elevated.  Please monitor this at home.  If it remains above 140/90 please return here or see your primary care for medication adjustment.  Avoid NSAIDs (aspirin, ibuprofen/Advil, naproxen/Aleve), decongestants, caffeine, sodium.  If you develop any chest pain, shortness of breath, headache, vision change, dizziness in the setting of high blood pressure you need to go to the emergency room.     ED Prescriptions     Medication Sig Dispense Auth. Provider   amoxicillin-clavulanate (AUGMENTIN) 875-125 MG tablet Take 1 tablet by mouth every 12 (twelve) hours. 14 tablet Lakishia Bourassa K, PA-C   fluticasone (FLONASE) 50 MCG/ACT nasal spray Place 1 spray into both nostrils daily. 16 g Dianah Pruett K, PA-C      PDMP not reviewed this encounter.   Terrilee Croak, PA-C 11/18/22 1257

## 2022-11-18 NOTE — ED Triage Notes (Signed)
Pt is here for nasal congestion, runny nose, and left ear pain x 5days

## 2022-11-18 NOTE — Discharge Instructions (Addendum)
I am concerned that you might have a bacterial infection.  Will start Augmentin twice daily.  Use Flonase to help with your congestion.  I also recommend that you use over-the-counter medications including Mucinex and multisymptom medications that are safe for high blood pressure such as Coricidin.  Make sure that you rest and drink plenty of fluid.  If your symptoms not improving within a week return for reevaluation.  If anything worsens you should be seen immediately.  Your blood pressure is very elevated.  Please monitor this at home.  If it remains above 140/90 please return here or see your primary care for medication adjustment.  Avoid NSAIDs (aspirin, ibuprofen/Advil, naproxen/Aleve), decongestants, caffeine, sodium.  If you develop any chest pain, shortness of breath, headache, vision change, dizziness in the setting of high blood pressure you need to go to the emergency room.

## 2023-08-04 IMAGING — CT CT VENOGRAM HEAD
1 of 2 series · 19 of 30 positions shown · non-contrast
Comparison: No prior CT V, correlation is made with MRI head
11/06/2021 and CT head 10/16/2005.

CLINICAL DATA: Headache, papilledema

EXAM:
CT VENOGRAM HEAD
TECHNIQUE: Venographic phase images of the brain were obtained following the
administration of intravenous contrast. Multiplanar reformats and
maximum intensity projections were generated.

[Series 4: head with · axial · 0.42mm/px · z∈[-161,-17]mm · 19 of 81 slices shown]
[im 5/81  brain]
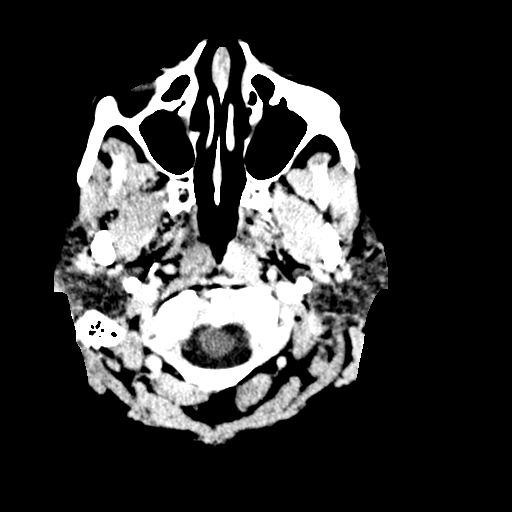
[im 9/81  bone]
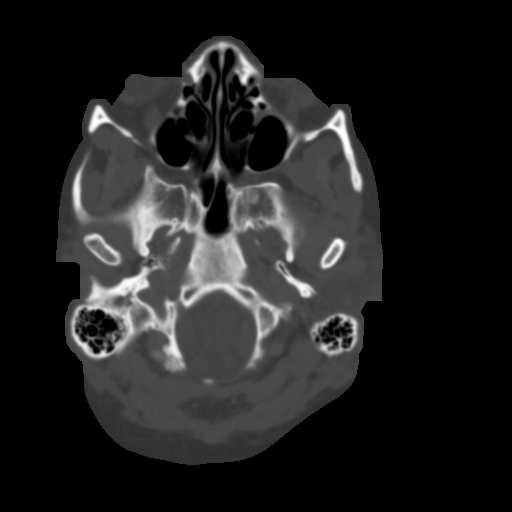
[im 13/81  brain]
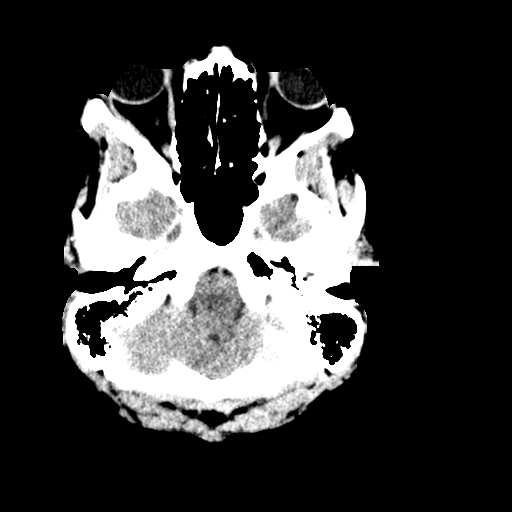
[im 17/81  bone]
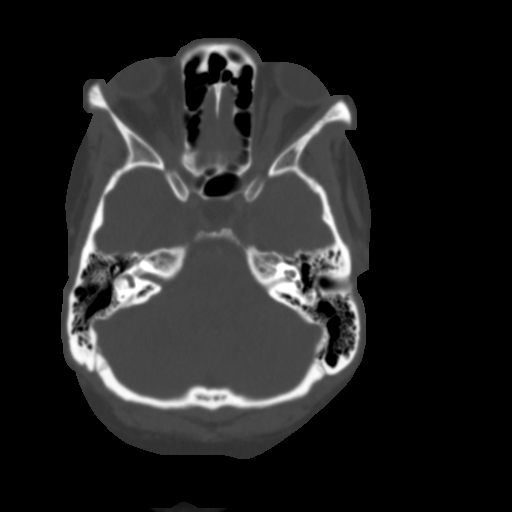
[im 21/81  brain]
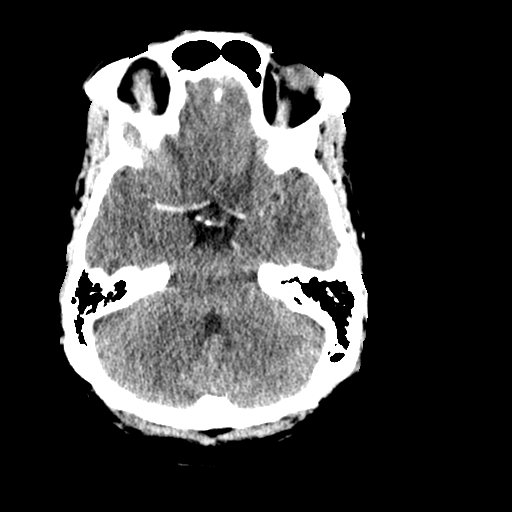
[im 25/81  bone]
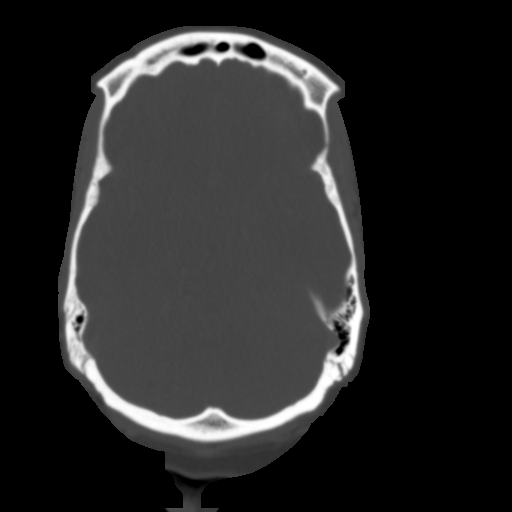
[im 29/81  brain]
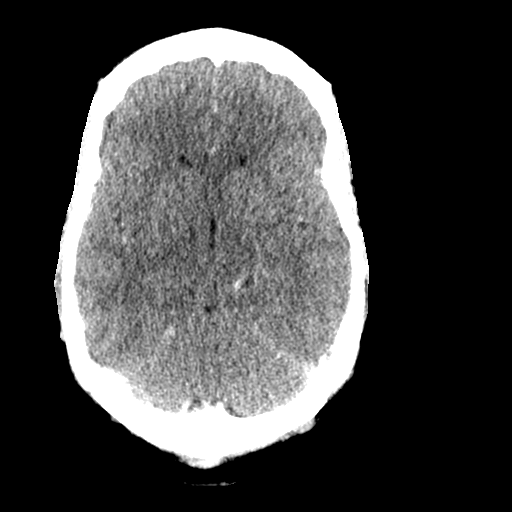
[im 33/81  bone]
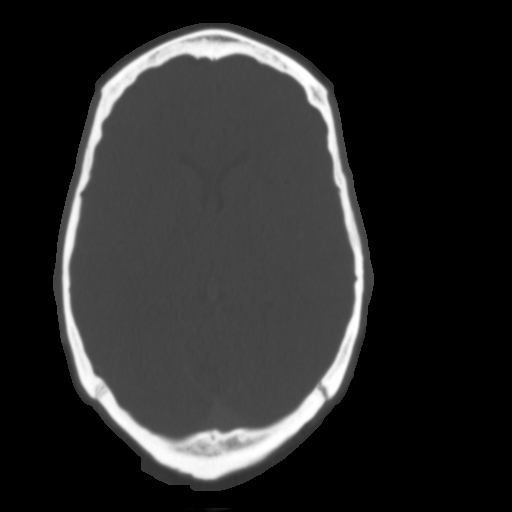
[im 37/81  brain]
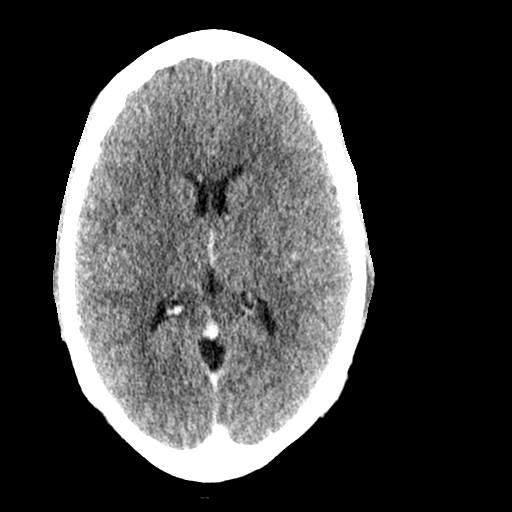
[im 41/81  bone]
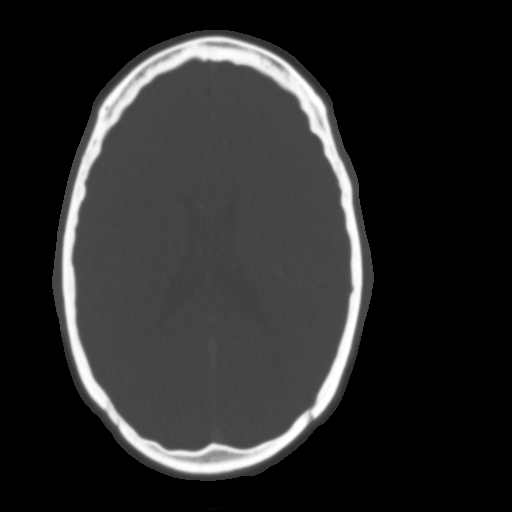
[im 45/81  brain]
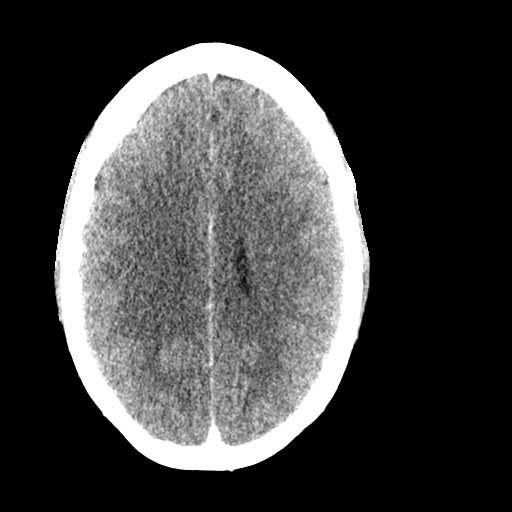
[im 49/81  bone]
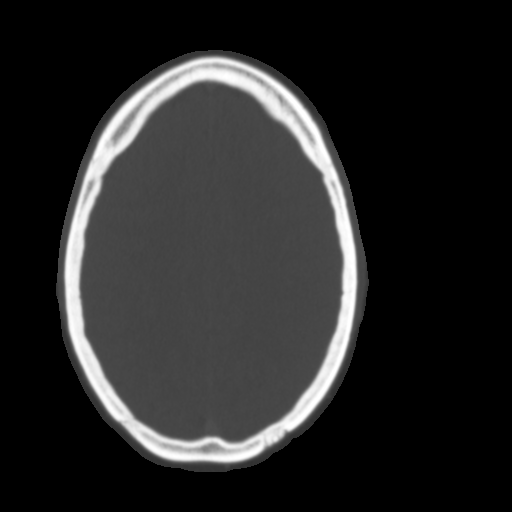
[im 53/81  brain]
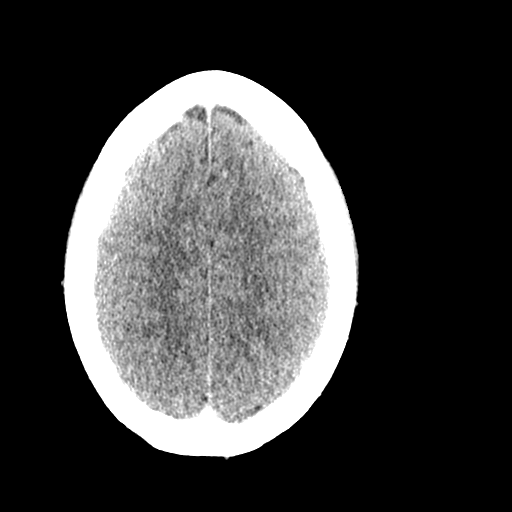
[im 57/81  bone]
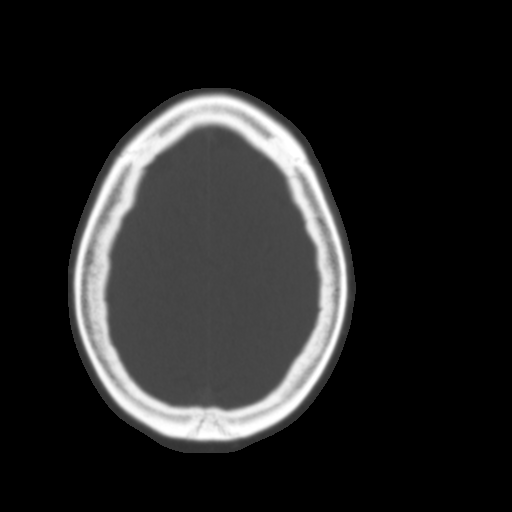
[im 61/81  brain]
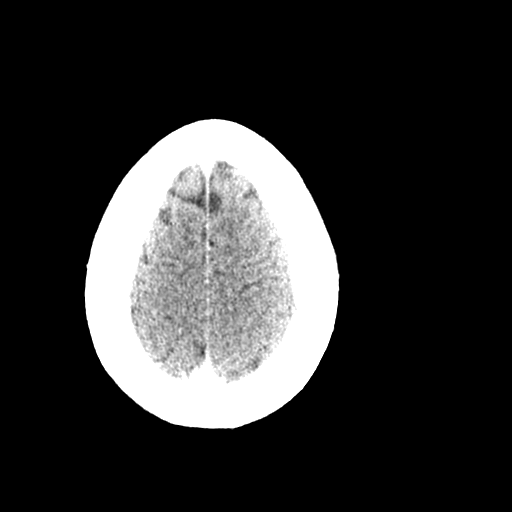
[im 65/81  bone]
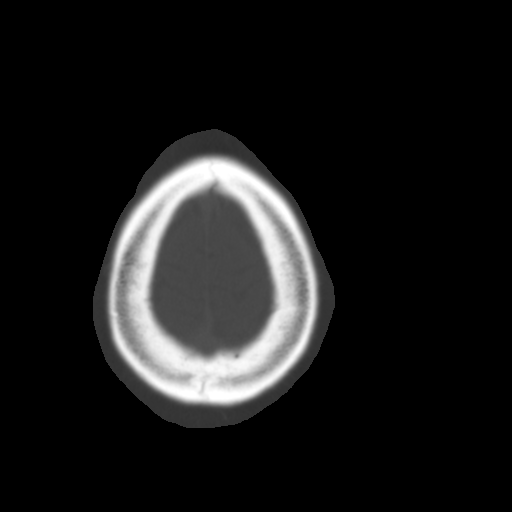
[im 69/81  brain]
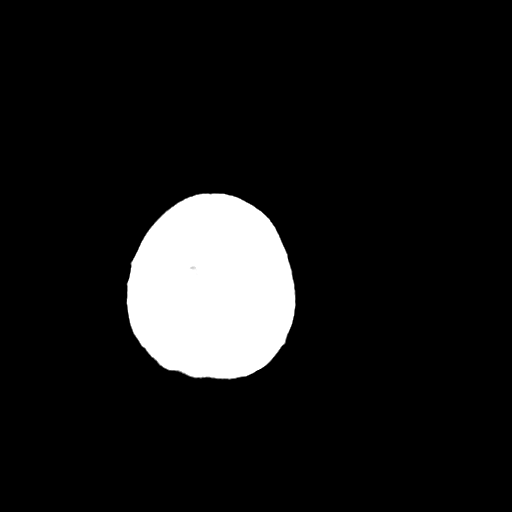
[im 73/81  bone]
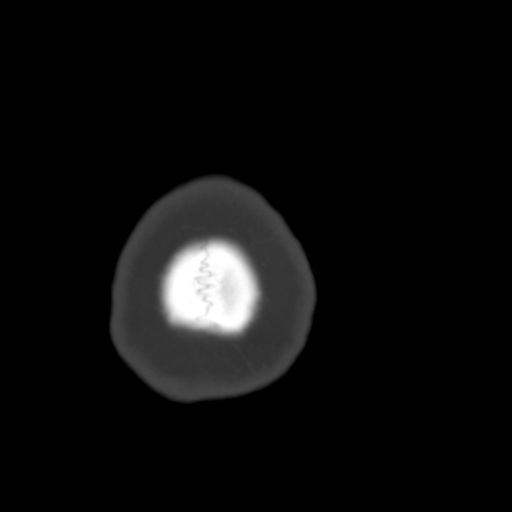
[im 77/81  brain]
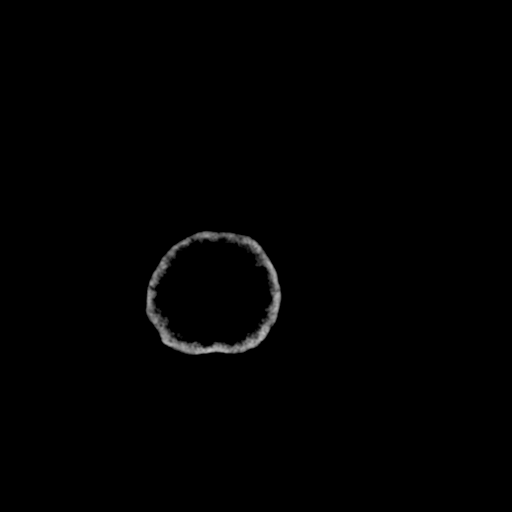

[19 of 30 positions shown; findings below may reference images not displayed]

RADIATION DOSE REDUCTION: This exam was performed according to the
departmental dose-optimization program which includes automated
exposure control, adjustment of the mA and/or kV according to
patient size and/or use of iterative reconstruction technique.

CONTRAST:  75mL T9JXZW-F8K IOPAMIDOL (T9JXZW-F8K) INJECTION 76%
FINDINGS: Superior sagittal sinus: Normal.

Straight sinus: Normal.

Inferior sagittal sinus, vein of [place] and internal cerebral veins:
Normal.

Transverse sinuses: Patent, but narrowed distally.

Sigmoid sinuses: Normal.

Visualized jugular veins: Normal

Partial empty sella.
IMPRESSION: 1. No evidence of venous sinus thrombosis.
2. Narrowing of the distal transverse sinuses at the transverse
sigmoid junction, with partial empty sella, as can be seen in the
setting of idiopathic intracranial hypertension.
